# Patient Record
Sex: Female | Born: 1965 | Race: White | Hispanic: No | Marital: Married | Smoking: Current every day smoker
Health system: Southern US, Community
[De-identification: ages and names within clinical notes are randomized; demographics above are authoritative.]

## PROBLEM LIST (undated history)

## (undated) DIAGNOSIS — J45909 Unspecified asthma, uncomplicated: Secondary | ICD-10-CM

## (undated) DIAGNOSIS — IMO0002 Reserved for concepts with insufficient information to code with codable children: Secondary | ICD-10-CM

## (undated) DIAGNOSIS — Q211 Atrial septal defect, unspecified: Secondary | ICD-10-CM

## (undated) DIAGNOSIS — F419 Anxiety disorder, unspecified: Secondary | ICD-10-CM

## (undated) DIAGNOSIS — K219 Gastro-esophageal reflux disease without esophagitis: Secondary | ICD-10-CM

## (undated) DIAGNOSIS — E7800 Pure hypercholesterolemia, unspecified: Secondary | ICD-10-CM

## (undated) DIAGNOSIS — J449 Chronic obstructive pulmonary disease, unspecified: Secondary | ICD-10-CM

## (undated) DIAGNOSIS — G43909 Migraine, unspecified, not intractable, without status migrainosus: Secondary | ICD-10-CM

## (undated) DIAGNOSIS — G894 Chronic pain syndrome: Secondary | ICD-10-CM

## (undated) DIAGNOSIS — M549 Dorsalgia, unspecified: Secondary | ICD-10-CM

## (undated) DIAGNOSIS — I639 Cerebral infarction, unspecified: Secondary | ICD-10-CM

## (undated) DIAGNOSIS — G4734 Idiopathic sleep related nonobstructive alveolar hypoventilation: Secondary | ICD-10-CM

## (undated) DIAGNOSIS — E785 Hyperlipidemia, unspecified: Secondary | ICD-10-CM

## (undated) DIAGNOSIS — I679 Cerebrovascular disease, unspecified: Secondary | ICD-10-CM

## (undated) DIAGNOSIS — I219 Acute myocardial infarction, unspecified: Secondary | ICD-10-CM

## (undated) DIAGNOSIS — F329 Major depressive disorder, single episode, unspecified: Secondary | ICD-10-CM

## (undated) DIAGNOSIS — F32A Depression, unspecified: Secondary | ICD-10-CM

## (undated) HISTORY — PX: APPENDECTOMY: SHX54

## (undated) HISTORY — PX: ABDOMINAL HYSTERECTOMY: SHX81

## (undated) HISTORY — PX: CARDIAC CATHETERIZATION: SHX172

## (undated) HISTORY — PX: CHOLECYSTECTOMY: SHX55

## (undated) HISTORY — PX: TONSILLECTOMY: SUR1361

---

## 2010-06-06 ENCOUNTER — Emergency Department (HOSPITAL_BASED_OUTPATIENT_CLINIC_OR_DEPARTMENT_OTHER)
Admission: EM | Admit: 2010-06-06 | Discharge: 2010-06-06 | Payer: Self-pay | Source: Home / Self Care | Admitting: Emergency Medicine

## 2010-07-04 ENCOUNTER — Emergency Department (HOSPITAL_BASED_OUTPATIENT_CLINIC_OR_DEPARTMENT_OTHER)
Admission: EM | Admit: 2010-07-04 | Discharge: 2010-07-04 | Payer: Self-pay | Source: Home / Self Care | Admitting: Emergency Medicine

## 2010-07-29 ENCOUNTER — Emergency Department (HOSPITAL_BASED_OUTPATIENT_CLINIC_OR_DEPARTMENT_OTHER)
Admission: EM | Admit: 2010-07-29 | Discharge: 2010-07-29 | Payer: Self-pay | Source: Home / Self Care | Admitting: Emergency Medicine

## 2011-02-05 ENCOUNTER — Emergency Department (HOSPITAL_BASED_OUTPATIENT_CLINIC_OR_DEPARTMENT_OTHER)
Admission: EM | Admit: 2011-02-05 | Discharge: 2011-02-05 | Disposition: A | Discharge status: Home / Self Care | Payer: BC Managed Care – PPO | Attending: Emergency Medicine | Admitting: Emergency Medicine

## 2011-02-05 ENCOUNTER — Emergency Department (INDEPENDENT_AMBULATORY_CARE_PROVIDER_SITE_OTHER): Payer: BC Managed Care – PPO

## 2011-02-05 DIAGNOSIS — R109 Unspecified abdominal pain: Secondary | ICD-10-CM | POA: Insufficient documentation

## 2011-02-05 DIAGNOSIS — K7689 Other specified diseases of liver: Secondary | ICD-10-CM | POA: Insufficient documentation

## 2011-02-05 DIAGNOSIS — R160 Hepatomegaly, not elsewhere classified: Secondary | ICD-10-CM | POA: Insufficient documentation

## 2011-02-05 DIAGNOSIS — Z79899 Other long term (current) drug therapy: Secondary | ICD-10-CM | POA: Insufficient documentation

## 2011-02-05 DIAGNOSIS — S301XXA Contusion of abdominal wall, initial encounter: Secondary | ICD-10-CM | POA: Insufficient documentation

## 2011-02-05 DIAGNOSIS — X58XXXA Exposure to other specified factors, initial encounter: Secondary | ICD-10-CM | POA: Insufficient documentation

## 2011-02-05 DIAGNOSIS — G8929 Other chronic pain: Secondary | ICD-10-CM | POA: Insufficient documentation

## 2011-02-05 DIAGNOSIS — F17200 Nicotine dependence, unspecified, uncomplicated: Secondary | ICD-10-CM | POA: Insufficient documentation

## 2011-02-05 LAB — DIFFERENTIAL
Basophils Absolute: 0 10*3/uL (ref 0.0–0.1)
Eosinophils Relative: 3 % (ref 0–5)
Lymphocytes Relative: 32 % (ref 12–46)
Lymphs Abs: 3.2 10*3/uL (ref 0.7–4.0)
Neutro Abs: 5.8 10*3/uL (ref 1.7–7.7)

## 2011-02-05 LAB — COMPREHENSIVE METABOLIC PANEL
Alkaline Phosphatase: 108 U/L (ref 39–117)
BUN: 12 mg/dL (ref 6–23)
CO2: 27 mEq/L (ref 19–32)
Chloride: 104 mEq/L (ref 96–112)
Creatinine, Ser: 0.5 mg/dL (ref 0.50–1.10)
GFR calc Af Amer: >60 mL/min (ref >60)
GFR calc non Af Amer: >60 mL/min (ref >60)
Glucose, Bld: 96 mg/dL (ref 70–99)
Potassium: 3.7 mEq/L (ref 3.5–5.1)
Total Bilirubin: 0.1 mg/dL — ABNORMAL LOW (ref 0.3–1.2)

## 2011-02-05 LAB — CBC
HCT: 37.1 % (ref 36.0–46.0)
Hemoglobin: 12.8 g/dL (ref 12.0–15.0)
MCV: 88.5 fL (ref 78.0–100.0)
RDW: 12.3 % (ref 11.5–15.5)
WBC: 10.1 10*3/uL (ref 4.0–10.5)

## 2011-03-02 ENCOUNTER — Emergency Department (INDEPENDENT_AMBULATORY_CARE_PROVIDER_SITE_OTHER): Payer: Medicare Other

## 2011-03-02 ENCOUNTER — Encounter: Payer: Self-pay | Admitting: *Deleted

## 2011-03-02 ENCOUNTER — Emergency Department (HOSPITAL_BASED_OUTPATIENT_CLINIC_OR_DEPARTMENT_OTHER)
Admission: EM | Admit: 2011-03-02 | Discharge: 2011-03-02 | Disposition: A | Discharge status: Home / Self Care | Payer: Medicare Other | Attending: Emergency Medicine | Admitting: Emergency Medicine

## 2011-03-02 DIAGNOSIS — Z79899 Other long term (current) drug therapy: Secondary | ICD-10-CM | POA: Insufficient documentation

## 2011-03-02 DIAGNOSIS — J449 Chronic obstructive pulmonary disease, unspecified: Secondary | ICD-10-CM | POA: Insufficient documentation

## 2011-03-02 DIAGNOSIS — R51 Headache: Secondary | ICD-10-CM

## 2011-03-02 DIAGNOSIS — E7800 Pure hypercholesterolemia, unspecified: Secondary | ICD-10-CM | POA: Insufficient documentation

## 2011-03-02 DIAGNOSIS — IMO0002 Reserved for concepts with insufficient information to code with codable children: Secondary | ICD-10-CM | POA: Insufficient documentation

## 2011-03-02 DIAGNOSIS — G9389 Other specified disorders of brain: Secondary | ICD-10-CM

## 2011-03-02 DIAGNOSIS — Z8679 Personal history of other diseases of the circulatory system: Secondary | ICD-10-CM | POA: Insufficient documentation

## 2011-03-02 DIAGNOSIS — J4489 Other specified chronic obstructive pulmonary disease: Secondary | ICD-10-CM | POA: Insufficient documentation

## 2011-03-02 DIAGNOSIS — I252 Old myocardial infarction: Secondary | ICD-10-CM | POA: Insufficient documentation

## 2011-03-02 DIAGNOSIS — F411 Generalized anxiety disorder: Secondary | ICD-10-CM | POA: Insufficient documentation

## 2011-03-02 HISTORY — DX: Migraine, unspecified, not intractable, without status migrainosus: G43.909

## 2011-03-02 HISTORY — DX: Cerebral infarction, unspecified: I63.9

## 2011-03-02 HISTORY — DX: Pure hypercholesterolemia, unspecified: E78.00

## 2011-03-02 HISTORY — DX: Reserved for concepts with insufficient information to code with codable children: IMO0002

## 2011-03-02 HISTORY — DX: Anxiety disorder, unspecified: F41.9

## 2011-03-02 HISTORY — DX: Chronic obstructive pulmonary disease, unspecified: J44.9

## 2011-03-02 HISTORY — DX: Acute myocardial infarction, unspecified: I21.9

## 2011-03-02 MED ORDER — HYDROMORPHONE HCL 1 MG/ML IJ SOLN
1.0000 mg | Freq: Once | INTRAMUSCULAR | Status: AC
  Administered 2011-03-02: 1 mg via INTRAVENOUS
  Filled 2011-03-02: qty 1

## 2011-03-02 MED ORDER — DEXAMETHASONE SODIUM PHOSPHATE 10 MG/ML IJ SOLN
10.0000 mg | Freq: Once | INTRAMUSCULAR | Status: AC
  Administered 2011-03-02: 10 mg via INTRAVENOUS
  Filled 2011-03-02: qty 1

## 2011-03-02 MED ORDER — DIPHENHYDRAMINE HCL 50 MG/ML IJ SOLN
25.0000 mg | Freq: Once | INTRAMUSCULAR | Status: AC
  Administered 2011-03-02: 25 mg via INTRAVENOUS
  Filled 2011-03-02: qty 1

## 2011-03-02 MED ORDER — ONDANSETRON HCL 4 MG/2ML IJ SOLN
4.0000 mg | Freq: Once | INTRAMUSCULAR | Status: AC
  Administered 2011-03-02: 4 mg via INTRAVENOUS
  Filled 2011-03-02: qty 2

## 2011-03-02 NOTE — ED Notes (Signed)
Migraine intermittently x2 weeks, but worsened this morning. Sensitive to light/sound. Nausea despite taking zofran today.
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
## 2011-03-14 ENCOUNTER — Emergency Department (HOSPITAL_BASED_OUTPATIENT_CLINIC_OR_DEPARTMENT_OTHER)
Admission: EM | Admit: 2011-03-14 | Discharge: 2011-03-15 | Disposition: A | Discharge status: Home / Self Care | Payer: BC Managed Care – PPO | Attending: Emergency Medicine | Admitting: Emergency Medicine

## 2011-03-14 ENCOUNTER — Emergency Department (INDEPENDENT_AMBULATORY_CARE_PROVIDER_SITE_OTHER): Payer: BC Managed Care – PPO

## 2011-03-14 ENCOUNTER — Encounter (HOSPITAL_BASED_OUTPATIENT_CLINIC_OR_DEPARTMENT_OTHER): Payer: Self-pay | Admitting: *Deleted

## 2011-03-14 ENCOUNTER — Other Ambulatory Visit: Payer: Self-pay

## 2011-03-14 DIAGNOSIS — R079 Chest pain, unspecified: Secondary | ICD-10-CM | POA: Insufficient documentation

## 2011-03-14 DIAGNOSIS — J4489 Other specified chronic obstructive pulmonary disease: Secondary | ICD-10-CM | POA: Insufficient documentation

## 2011-03-14 DIAGNOSIS — I517 Cardiomegaly: Secondary | ICD-10-CM

## 2011-03-14 DIAGNOSIS — E7800 Pure hypercholesterolemia, unspecified: Secondary | ICD-10-CM | POA: Insufficient documentation

## 2011-03-14 DIAGNOSIS — J449 Chronic obstructive pulmonary disease, unspecified: Secondary | ICD-10-CM | POA: Insufficient documentation

## 2011-03-14 DIAGNOSIS — Z8679 Personal history of other diseases of the circulatory system: Secondary | ICD-10-CM | POA: Insufficient documentation

## 2011-03-14 DIAGNOSIS — I252 Old myocardial infarction: Secondary | ICD-10-CM | POA: Insufficient documentation

## 2011-03-14 DIAGNOSIS — R6884 Jaw pain: Secondary | ICD-10-CM | POA: Insufficient documentation

## 2011-03-14 DIAGNOSIS — M79609 Pain in unspecified limb: Secondary | ICD-10-CM | POA: Insufficient documentation

## 2011-03-14 LAB — CBC
MCV: 90.8 fL (ref 78.0–100.0)
Platelets: 291 10*3/uL (ref 150–400)
RBC: 4.14 MIL/uL (ref 3.87–5.11)
RDW: 12.8 % (ref 11.5–15.5)
WBC: 9.5 10*3/uL (ref 4.0–10.5)

## 2011-03-14 LAB — BASIC METABOLIC PANEL
CO2: 30 mEq/L (ref 19–32)
Chloride: 103 mEq/L (ref 96–112)
Creatinine, Ser: 0.6 mg/dL (ref 0.50–1.10)
GFR calc Af Amer: >60 mL/min (ref >60)
Sodium: 141 mEq/L (ref 135–145)

## 2011-03-14 LAB — TROPONIN I: Troponin I: <0.3 ng/mL (ref <0.30)

## 2011-03-14 LAB — CK TOTAL AND CKMB (NOT AT ARMC): Relative Index: INVALID (ref 0.0–2.5)

## 2011-03-14 MED ORDER — NITROGLYCERIN IN D5W 200-5 MCG/ML-% IV SOLN
5.0000 ug/min | Freq: Once | INTRAVENOUS | Status: AC
  Administered 2011-03-14: 5 ug/min via INTRAVENOUS
  Filled 2011-03-14: qty 250

## 2011-03-14 NOTE — ED Provider Notes (Signed)
History     CSN: [id number] Arrival date & time: 03/14/2011  9:24 PM  Chief Complaint  Patient presents with  . Chest Pain   Patient is a 45 y.o. female presenting with chest pain. The history is provided by the patient.  Chest Pain The chest pain began 1 - 2 hours ago. Chest pain occurs constantly. The chest pain is unchanged. The severity of the pain is moderate. The quality of the pain is described as aching. The pain radiates to the left jaw and left shoulder. Pertinent negatives for primary symptoms include no fever, no fatigue, no cough, no abdominal pain, no nausea and no vomiting.  Pertinent negatives for associated symptoms include no diaphoresis.   patient states that she has had chest pain radiating to her left arm and jaw for the last 2 hours. She states that she does not know if this feels like her previous heart problems. She states that she took a zofran and nitro with some relief of the pain. She states that she now has a headahce. No dyspnea. No fevers. She states that her cardiologist put mesh in the back of her heart. She states that she needs something for the chest pain.   Past Medical History  Diagnosis Date  . Stroke   . MI (myocardial infarction)   . High cholesterol   . Degenerative disc disease   . COPD (chronic obstructive pulmonary disease)   . Migraines   . Anxiety     Past Surgical History  Procedure Date  . Abdominal hysterectomy   . Cardiac catheterization   . Cesarean section   . Cholecystectomy   . Tonsillectomy   . Appendectomy     No family history on file.  History  Substance Use Topics  . Smoking status: Current Everyday Smoker  . Smokeless tobacco: Not on file  . Alcohol Use: No    OB History    Grav Para Term Preterm Abortions TAB SAB Ect Mult Living                  Review of Systems  Constitutional: Negative for fever, diaphoresis, activity change, appetite change and fatigue.  HENT: Negative for sneezing and sinus  pressure.   Respiratory: Negative for cough and stridor.   Cardiovascular: Positive for chest pain.  Gastrointestinal: Negative for nausea, vomiting, abdominal pain and constipation.  Genitourinary: Negative for flank pain.  Musculoskeletal: Negative for back pain.  Neurological: Positive for headaches. Negative for light-headedness.  Psychiatric/Behavioral: Negative for confusion.    Physical Exam  BP 105/69  Pulse 79  SpO2 97%  Physical Exam  Nursing note and vitals reviewed. Constitutional: She is oriented to person, place, and time. She appears well-developed and well-nourished.  HENT:  Head: Normocephalic and atraumatic.  Eyes: EOM are normal. Pupils are equal, round, and reactive to light.  Neck: Normal range of motion. Neck supple.  Cardiovascular: Normal rate, regular rhythm and normal heart sounds.   No murmur heard. Pulmonary/Chest: Effort normal and breath sounds normal. No respiratory distress. She has no wheezes. She has no rales. She exhibits tenderness.  Abdominal: Soft. Bowel sounds are normal. She exhibits no distension. There is no tenderness. There is no rebound and no guarding.  Musculoskeletal: Normal range of motion.  Neurological: She is alert and oriented to person, place, and time. No cranial nerve deficit.  Skin: Skin is warm and dry.  Psychiatric: She has a normal mood and affect. Her speech is normal.    ED  Course  Procedures  Date: 03/14/2011  Rate: 2128  Rhythm: normal sinus rhythm  QRS Axis: normal  Intervals: normal  ST/T Wave abnormalities: normal  Conduction Disutrbances:incomplete RBBB  Narrative Interpretation:   Old EKG Reviewed: none available  Results for orders placed during the hospital encounter of 03/14/11  CBC      Component Value Range   WBC 9.5  4.0 - 10.5 (K/uL)   RBC 4.14  3.87 - 5.11 (MIL/uL)   Hemoglobin 12.6  12.0 - 15.0 (g/dL)   HCT 91.4  78.2 - 95.6 (%)   MCV 90.8  78.0 - 100.0 (fL)   MCH 30.4  26.0 - 34.0 (pg)     MCHC 33.5  30.0 - 36.0 (g/dL)   RDW 21.3  08.6 - 57.8 (%)   Platelets 291  150 - 400 (K/uL)  BASIC METABOLIC PANEL      Component Value Range   Sodium 141  135 - 145 (mEq/L)   Potassium 3.7  3.5 - 5.1 (mEq/L)   Chloride 103  96 - 112 (mEq/L)   CO2 30  19 - 32 (mEq/L)   Glucose, Bld 113 (*) 70 - 99 (mg/dL)   BUN 15  6 - 23 (mg/dL)   Creatinine, Ser 4.69  0.50 - 1.10 (mg/dL)   Calcium 9.3  8.4 - 62.9 (mg/dL)   GFR calc non Af Amer >60  >60 (mL/min)   GFR calc Af Amer >60  >60 (mL/min)  TROPONIN I      Component Value Range   Troponin I <0.30  <0.30 (ng/mL)  CK TOTAL AND CKMB      Component Value Range   Total CK 70  7 - 177 (U/L)   CK, MB 1.3  0.3 - 4.0 (ng/mL)   Relative Index RELATIVE INDEX IS INVALID  0.0 - 2.5     Dg Chest Portable 1 View  03/14/2011  *RADIOLOGY REPORT*  Clinical Data: Chest pain  PORTABLE CHEST - 1 VIEW  Comparison: None.  Findings: Interstitial prominence, possibly reflecting mild interstitial edema. No pleural effusion or pneumothorax.  Cardiomegaly.  IMPRESSION: Cardiomegaly with interstitial prominence, possibly reflecting mild interstitial edema.  Original Report Authenticated By: Charline Bills, M.D.    MDM Chest pain. Reported cardiac history. EKG and labs reassuring. Patient does not want to stay for further evaluation of her chest pain. She states that she will follow with her cardiologist. She asks if she can just get some Dilaudid for her headache. She was told she would not.       Juliet Rude. Rubin Payor, MD 03/14/11 [phone]

## 2011-03-14 NOTE — ED Notes (Signed)
Pt reports NTG from home expired in 2010.

## 2011-03-14 NOTE — ED Notes (Signed)
Pt began having chest pain at 8pm,left chest radiating to left arm and into left jaw. Was nauseated and took some zofran, nitro, and aspirin. Pt has a hx of MI, CABG, and stroke, current smoker

## 2011-07-17 ENCOUNTER — Emergency Department (INDEPENDENT_AMBULATORY_CARE_PROVIDER_SITE_OTHER): Payer: BC Managed Care – PPO

## 2011-07-17 ENCOUNTER — Emergency Department (HOSPITAL_BASED_OUTPATIENT_CLINIC_OR_DEPARTMENT_OTHER)
Admission: EM | Admit: 2011-07-17 | Discharge: 2011-07-17 | Disposition: A | Discharge status: Home / Self Care | Payer: BC Managed Care – PPO | Attending: Emergency Medicine | Admitting: Emergency Medicine

## 2011-07-17 ENCOUNTER — Encounter (HOSPITAL_BASED_OUTPATIENT_CLINIC_OR_DEPARTMENT_OTHER): Payer: Self-pay | Admitting: *Deleted

## 2011-07-17 DIAGNOSIS — M25559 Pain in unspecified hip: Secondary | ICD-10-CM | POA: Insufficient documentation

## 2011-07-17 DIAGNOSIS — M25551 Pain in right hip: Secondary | ICD-10-CM

## 2011-07-17 DIAGNOSIS — I252 Old myocardial infarction: Secondary | ICD-10-CM | POA: Insufficient documentation

## 2011-07-17 DIAGNOSIS — F17200 Nicotine dependence, unspecified, uncomplicated: Secondary | ICD-10-CM | POA: Insufficient documentation

## 2011-07-17 DIAGNOSIS — J449 Chronic obstructive pulmonary disease, unspecified: Secondary | ICD-10-CM | POA: Insufficient documentation

## 2011-07-17 DIAGNOSIS — J4489 Other specified chronic obstructive pulmonary disease: Secondary | ICD-10-CM | POA: Insufficient documentation

## 2011-07-17 DIAGNOSIS — W19XXXA Unspecified fall, initial encounter: Secondary | ICD-10-CM

## 2011-07-17 DIAGNOSIS — M79609 Pain in unspecified limb: Secondary | ICD-10-CM

## 2011-07-17 DIAGNOSIS — M25561 Pain in right knee: Secondary | ICD-10-CM

## 2011-07-17 DIAGNOSIS — Z79899 Other long term (current) drug therapy: Secondary | ICD-10-CM | POA: Insufficient documentation

## 2011-07-17 DIAGNOSIS — M25569 Pain in unspecified knee: Secondary | ICD-10-CM

## 2011-07-17 DIAGNOSIS — Z8679 Personal history of other diseases of the circulatory system: Secondary | ICD-10-CM | POA: Insufficient documentation

## 2011-07-17 DIAGNOSIS — M79639 Pain in unspecified forearm: Secondary | ICD-10-CM

## 2011-07-17 DIAGNOSIS — M25539 Pain in unspecified wrist: Secondary | ICD-10-CM | POA: Insufficient documentation

## 2011-07-17 DIAGNOSIS — E7800 Pure hypercholesterolemia, unspecified: Secondary | ICD-10-CM | POA: Insufficient documentation

## 2011-07-17 NOTE — ED Notes (Signed)
D/c home with family- no rx given 

## 2011-07-17 NOTE — ED Notes (Signed)
States she fell in parking lot on painted surface that was wet- fall occurred last night- c/o right knee and hip pain; c/o left arm pain- denies LOC

## 2011-07-17 NOTE — ED Provider Notes (Signed)
History     CSN: [id number] Arrival date & time: 07/17/2011  4:33 PM   First MD Initiated Contact with Patient 07/17/11 1715      Chief Complaint  Patient presents with  . Fall  . Knee Pain  . Hip Pain  . Arm Pain    (Consider location/radiation/quality/duration/timing/severity/associated sxs/prior treatment) Patient is a 45 y.o. female presenting with fall. The history is provided by the patient. No language interpreter was used.  Fall The accident occurred yesterday. The fall occurred while walking. She fell from a height of 3 to 5 ft. She landed on concrete. There was no blood loss. The point of impact was the left elbow. The pain is present in the right hip. The pain is at a severity of 5/10. The pain is moderate. She was ambulatory at the scene. There was no entrapment after the fall. There was no drug use involved in the accident. There was no alcohol use involved in the accident. Pertinent negatives include no visual change, no numbness and no vomiting. The symptoms are aggravated by standing. She has tried ice for the symptoms. The treatment provided moderate relief.  Pt reports she slipped and fell on wet ground at gas station yesterday.  Pt complains of pain in herright knee, right hip and left forearm.  No impact of head.  No neck or back pain.  Pt reports she has is on morphine for chronic problems. Pt reports she does not need anything for pain.  Past Medical History  Diagnosis Date  . Stroke   . MI (myocardial infarction)   . High cholesterol   . Degenerative disc disease   . COPD (chronic obstructive pulmonary disease)   . Migraines   . Anxiety     Past Surgical History  Procedure Date  . Abdominal hysterectomy   . Cardiac catheterization   . Cesarean section   . Cholecystectomy   . Tonsillectomy   . Appendectomy     No family history on file.  History  Substance Use Topics  . Smoking status: Current Everyday Smoker -- 1.0 packs/day    Types: Cigarettes    . Smokeless tobacco: Never Used  . Alcohol Use: No    OB History    Grav Para Term Preterm Abortions TAB SAB Ect Mult Living                  Review of Systems  Gastrointestinal: Negative for vomiting.  Musculoskeletal: Positive for myalgias. Negative for joint swelling and gait problem.  Neurological: Negative for numbness.  All other systems reviewed and are negative.    Allergies  Shellfish allergy; Fioricet; Imitrex; Lodine; Phenergan; Prednisone; Reglan; Relpax; Stadol; Topamax; Toradol; Tramadol; and Vistaril  Home Medications   Current Outpatient Rx  Name Route Sig Dispense Refill  . ASPIRIN 81 MG PO TABS Oral Take 81 mg by mouth daily.      . ATORVASTATIN CALCIUM 80 MG PO TABS Oral Take 80 mg by mouth daily.      [employer] IN Inhalation Inhale 2 puffs into the lungs at bedtime.      Marland Kitchen CLONAZEPAM 1 MG PO TABS Oral Take 1 mg by mouth 3 (three) times daily.      Marland Kitchen CLOPIDOGREL BISULFATE 75 MG PO TABS Oral Take 75 mg by mouth daily.      . DESVENLAFAXINE SUCCINATE ER 50 MG PO TB24 Oral Take 50 mg by mouth 2 (two) times daily.      Marland Kitchen DIVALPROEX SODIUM  ER 500 MG PO TB24 Oral Take 500 mg by mouth 2 (two) times daily.      Marland Kitchen FLUTICASONE-SALMETEROL 500-50 MCG/DOSE IN AEPB Inhalation Inhale 1 puff into the lungs every 12 (twelve) hours.      . FUROSEMIDE 20 MG PO TABS Oral Take 20 mg by mouth daily.     Marland Kitchen GABAPENTIN 300 MG PO CAPS Oral Take 600 mg by mouth 3 (three) times daily.      Marland Kitchen LISINOPRIL 40 MG PO TABS Oral Take 40 mg by mouth daily.      . MORPHINE SULFATE 15 MG PO TABS Oral Take 15 mg by mouth 3 (three) times daily.      Marland Kitchen ONDANSETRON HCL 8 MG PO TABS Oral Take 8 mg by mouth 2 (two) times daily as needed. Nausea and vomiting     . PANTOPRAZOLE SODIUM 40 MG PO TBEC Oral Take 40 mg by mouth daily.      Marland Kitchen ZOLPIDEM TARTRATE 10 MG PO TABS Oral Take 10 mg by mouth at bedtime.      Marland Kitchen NITROGLYCERIN 0.4 MG SL SUBL Sublingual Place 0.4 mg under the tongue every 5 (five)  minutes as needed.        BP 114/74  Pulse 99  Temp(Src) 98.8 F (37.1 C) (Oral)  Resp 18  Ht 5\' 6"  (1.676 m)  Wt 204 lb (92.534 kg)  BMI 32.93 kg/m2  SpO2 94%  Physical Exam  Nursing note and vitals reviewed. Constitutional: She appears well-developed and well-nourished.  HENT:  Head: Normocephalic and atraumatic.  Right Ear: External ear normal.  Left Ear: External ear normal.  Mouth/Throat: Oropharynx is clear and moist.  Eyes: Conjunctivae and EOM are normal. Pupils are equal, round, and reactive to light.  Neck: Normal range of motion. Neck supple.  Cardiovascular: Normal rate, regular rhythm and normal heart sounds.   Pulmonary/Chest: Effort normal.  Abdominal: Soft.  Musculoskeletal: Normal range of motion.       Tender right hip, no deformity,  Right knee pain with movement.  Left forearm,  No bruising,  From wrist and elbow  Neurological: She is alert.  Skin: Skin is warm.  Psychiatric: She has a normal mood and affect.    ED Course  Procedures (including critical care time)  Labs Reviewed - No data to display No results found.   No diagnosis found.    MDM  No fx,  Pt advised to continue her pain medications.    Results for orders placed during the hospital encounter of 03/14/11  CBC      Component Value Range   WBC 9.5  4.0 - 10.5 (K/uL)   RBC 4.14  3.87 - 5.11 (MIL/uL)   Hemoglobin 12.6  12.0 - 15.0 (g/dL)   HCT 65.7  84.6 - 96.2 (%)   MCV 90.8  78.0 - 100.0 (fL)   MCH 30.4  26.0 - 34.0 (pg)   MCHC 33.5  30.0 - 36.0 (g/dL)   RDW 95.2  84.1 - 32.4 (%)   Platelets 291  150 - 400 (K/uL)  BASIC METABOLIC PANEL      Component Value Range   Sodium 141  135 - 145 (mEq/L)   Potassium 3.7  3.5 - 5.1 (mEq/L)   Chloride 103  96 - 112 (mEq/L)   CO2 30  19 - 32 (mEq/L)   Glucose, Bld 113 (*) 70 - 99 (mg/dL)   BUN 15  6 - 23 (mg/dL)   Creatinine, Ser 4.01  0.50 - 1.10 (mg/dL)   Calcium 9.3  8.4 - 54.0 (mg/dL)   GFR calc non Af Amer >60  >60 (mL/min)     GFR calc Af Amer >60  >60 (mL/min)  TROPONIN I      Component Value Range   Troponin I <0.30  <0.30 (ng/mL)  CK TOTAL AND CKMB      Component Value Range   Total CK 70  7 - 177 (U/L)   CK, MB 1.3  0.3 - 4.0 (ng/mL)   Relative Index RELATIVE INDEX IS INVALID  0.0 - 2.5    Dg Forearm Left  07/17/2011  *RADIOLOGY REPORT*  Clinical Data: Fall.  Pain along the ulnar side of the forearm.  LEFT FOREARM - 2 VIEW  Comparison: None.  Findings: No acute bone or soft tissue abnormality is present.  The wrist and elbow joints are intact.  IMPRESSION: Negative left forearm.  Original Report Authenticated By: Jamesetta Orleans. MATTERN, M.D.   Dg Hip Complete Right  07/17/2011  *RADIOLOGY REPORT*  Clinical Data: Fall.  Right hip pain.  RIGHT HIP - COMPLETE 2+ VIEW  Comparison: None.  Findings: Single AP view the pelvis is unremarkable.  The right hip is located.  No acute bone or soft tissue abnormality is present.  IMPRESSION: Negative right hip.  Original Report Authenticated By: Jamesetta Orleans. MATTERN, M.D.   Dg Knee Complete 4 Views Right  07/17/2011  *RADIOLOGY REPORT*  Clinical Data: Fall.  Right knee pain.  RIGHT KNEE - COMPLETE 4+ VIEW  Comparison: None available.  Findings: Right knee is located.  No acute bone or soft tissue abnormality is present.  There is no significant effusion.  IMPRESSION: Negative right knee.  Original Report Authenticated By: Jamesetta Orleans. MATTERN, M.D.           [place], [place] 07/17/11 [phone]

## 2011-07-17 NOTE — ED Provider Notes (Signed)
Medical screening examination/treatment/procedure(s) were performed by non-physician practitioner and as supervising physician I was immediately available for consultation/collaboration.   Forbes Cellar, MD 07/17/11 [phone]

## 2011-10-04 ENCOUNTER — Emergency Department (INDEPENDENT_AMBULATORY_CARE_PROVIDER_SITE_OTHER): Payer: BC Managed Care – PPO

## 2011-10-04 ENCOUNTER — Emergency Department (HOSPITAL_BASED_OUTPATIENT_CLINIC_OR_DEPARTMENT_OTHER)
Admission: EM | Admit: 2011-10-04 | Discharge: 2011-10-04 | Disposition: A | Discharge status: Home / Self Care | Payer: BC Managed Care – PPO | Attending: Emergency Medicine | Admitting: Emergency Medicine

## 2011-10-04 ENCOUNTER — Encounter (HOSPITAL_BASED_OUTPATIENT_CLINIC_OR_DEPARTMENT_OTHER): Payer: Self-pay | Admitting: *Deleted

## 2011-10-04 DIAGNOSIS — J449 Chronic obstructive pulmonary disease, unspecified: Secondary | ICD-10-CM | POA: Insufficient documentation

## 2011-10-04 DIAGNOSIS — S7010XA Contusion of unspecified thigh, initial encounter: Secondary | ICD-10-CM | POA: Insufficient documentation

## 2011-10-04 DIAGNOSIS — L03116 Cellulitis of left lower limb: Secondary | ICD-10-CM

## 2011-10-04 DIAGNOSIS — W1809XA Striking against other object with subsequent fall, initial encounter: Secondary | ICD-10-CM | POA: Insufficient documentation

## 2011-10-04 DIAGNOSIS — E7800 Pure hypercholesterolemia, unspecified: Secondary | ICD-10-CM | POA: Insufficient documentation

## 2011-10-04 DIAGNOSIS — L02419 Cutaneous abscess of limb, unspecified: Secondary | ICD-10-CM | POA: Insufficient documentation

## 2011-10-04 DIAGNOSIS — I252 Old myocardial infarction: Secondary | ICD-10-CM | POA: Insufficient documentation

## 2011-10-04 DIAGNOSIS — Z8673 Personal history of transient ischemic attack (TIA), and cerebral infarction without residual deficits: Secondary | ICD-10-CM | POA: Insufficient documentation

## 2011-10-04 DIAGNOSIS — J4489 Other specified chronic obstructive pulmonary disease: Secondary | ICD-10-CM | POA: Insufficient documentation

## 2011-10-04 DIAGNOSIS — M79609 Pain in unspecified limb: Secondary | ICD-10-CM | POA: Insufficient documentation

## 2011-10-04 DIAGNOSIS — S8012XA Contusion of left lower leg, initial encounter: Secondary | ICD-10-CM

## 2011-10-04 DIAGNOSIS — W19XXXA Unspecified fall, initial encounter: Secondary | ICD-10-CM

## 2011-10-04 DIAGNOSIS — Z79899 Other long term (current) drug therapy: Secondary | ICD-10-CM | POA: Insufficient documentation

## 2011-10-04 DIAGNOSIS — Z7982 Long term (current) use of aspirin: Secondary | ICD-10-CM | POA: Insufficient documentation

## 2011-10-04 LAB — CBC
HCT: 42.1 % (ref 36.0–46.0)
Hemoglobin: 14.4 g/dL (ref 12.0–15.0)
RBC: 4.71 MIL/uL (ref 3.87–5.11)
WBC: 8.9 10*3/uL (ref 4.0–10.5)

## 2011-10-04 LAB — BASIC METABOLIC PANEL
BUN: 10 mg/dL (ref 6–23)
CO2: 30 mEq/L (ref 19–32)
Chloride: 103 mEq/L (ref 96–112)
Creatinine, Ser: 0.6 mg/dL (ref 0.50–1.10)
Potassium: 3.7 mEq/L (ref 3.5–5.1)

## 2011-10-04 LAB — DIFFERENTIAL
Lymphocytes Relative: 47 % — ABNORMAL HIGH (ref 12–46)
Monocytes Absolute: 0.6 10*3/uL (ref 0.1–1.0)
Monocytes Relative: 7 % (ref 3–12)
Neutro Abs: 3.8 10*3/uL (ref 1.7–7.7)
Neutrophils Relative %: 42 % — ABNORMAL LOW (ref 43–77)

## 2011-10-04 MED ORDER — CEPHALEXIN 500 MG PO CAPS: 500.0000 mg | ORAL_CAPSULE | Freq: Four times a day (QID) | ORAL | Status: DC

## 2011-10-04 MED ORDER — DIPHENHYDRAMINE HCL 50 MG/ML IJ SOLN
12.5000 mg | Freq: Once | INTRAMUSCULAR | Status: AC
  Administered 2011-10-04: 12.5 mg via INTRAVENOUS

## 2011-10-04 MED ORDER — DOXYCYCLINE HYCLATE 100 MG PO CAPS: 100.0000 mg | ORAL_CAPSULE | Freq: Two times a day (BID) | ORAL | Status: AC

## 2011-10-04 MED ORDER — CEPHALEXIN 500 MG PO CAPS: 500.0000 mg | ORAL_CAPSULE | Freq: Four times a day (QID) | ORAL | Status: AC

## 2011-10-04 MED ORDER — SODIUM CHLORIDE 0.9 % IV SOLN
Freq: Once | INTRAVENOUS | Status: AC
  Administered 2011-10-04: 22:00:00 via INTRAVENOUS

## 2011-10-04 MED ORDER — DIPHENHYDRAMINE HCL 50 MG/ML IJ SOLN
INTRAMUSCULAR | Status: AC
  Filled 2011-10-04: qty 1

## 2011-10-04 MED ORDER — VANCOMYCIN HCL IN DEXTROSE 1-5 GM/200ML-% IV SOLN
1000.0000 mg | Freq: Once | INTRAVENOUS | Status: AC
  Administered 2011-10-04: 1000 mg via INTRAVENOUS
  Filled 2011-10-04: qty 200

## 2011-10-04 MED ORDER — DEXTROSE 5 % IV SOLN
1.0000 g | INTRAVENOUS | Status: DC
  Administered 2011-10-04: 1 g via INTRAVENOUS
  Filled 2011-10-04: qty 10

## 2011-10-04 NOTE — ED Notes (Signed)
Area of swelling and redness to left leg and pain down left leg that started around noon today

## 2011-10-04 NOTE — ED Notes (Signed)
Pt alert oriented x 3 amb, redness has cleared, pt states no resp difficulty

## 2011-10-04 NOTE — ED Notes (Signed)
Pt c/o itching and redness to face, neck and back, vanc stopped and PA notified. Benadryl given

## 2011-10-04 NOTE — Discharge Instructions (Signed)

## 2011-10-04 NOTE — ED Provider Notes (Signed)
History     CSN: [id number]  Arrival date & time 10/04/11  2010   First MD Initiated Contact with Patient 10/04/11 2107      Chief Complaint  Patient presents with  . Leg Pain    (Consider location/radiation/quality/duration/timing/severity/associated sxs/prior treatment) Patient is a 46 y.o. female presenting with leg pain. The history is provided by the patient. No language interpreter was used.  Leg Pain  The incident occurred 6 to 12 hours ago. The incident occurred at home. The injury mechanism was a fall. The pain is present in the left leg. The quality of the pain is described as aching. The pain is at a severity of 4/10. The pain is moderate. The pain has been constant since onset. She reports no foreign bodies present. The symptoms are aggravated by nothing. She has tried nothing for the symptoms.  Pt reports she fell 2 days ago and hit leg.  Pt reports today she noticed a red streak going down her leg.  Past Medical History  Diagnosis Date  . Stroke   . MI (myocardial infarction)   . High cholesterol   . Degenerative disc disease   . COPD (chronic obstructive pulmonary disease)   . Migraines   . Anxiety     Past Surgical History  Procedure Date  . Abdominal hysterectomy   . Cardiac catheterization   . Cesarean section   . Cholecystectomy   . Tonsillectomy   . Appendectomy     No family history on file.  History  Substance Use Topics  . Smoking status: Current Everyday Smoker -- 1.0 packs/day    Types: Cigarettes  . Smokeless tobacco: Never Used  . Alcohol Use: No    OB History    Grav Para Term Preterm Abortions TAB SAB Ect Mult Living                  Review of Systems  Skin: Positive for color change.  All other systems reviewed and are negative.    Allergies  Shellfish allergy; Fioricet; Imitrex; Lodine; Phenergan; Prednisone; Reglan; Relpax; Stadol; Topamax; Toradol; Tramadol; and Vistaril  Home Medications   Current Outpatient Rx    Name Route Sig Dispense Refill  . ASPIRIN 81 MG PO TABS Oral Take 81 mg by mouth daily.      . ATORVASTATIN CALCIUM 80 MG PO TABS Oral Take 80 mg by mouth daily.      [employer] IN Inhalation Inhale 2 puffs into the lungs at bedtime.      Marland Kitchen CLONAZEPAM 1 MG PO TABS Oral Take 1 mg by mouth 4 (four) times daily.     . DESVENLAFAXINE SUCCINATE ER 50 MG PO TB24 Oral Take 50 mg by mouth 2 (two) times daily.      Marland Kitchen DIVALPROEX SODIUM ER 500 MG PO TB24 Oral Take 500 mg by mouth 2 (two) times daily.     Marland Kitchen FLUTICASONE-SALMETEROL 500-50 MCG/DOSE IN AEPB Inhalation Inhale 1 puff into the lungs every 12 (twelve) hours.      . FUROSEMIDE 20 MG PO TABS Oral Take 20 mg by mouth daily.     Marland Kitchen GABAPENTIN 400 MG PO CAPS Oral Take 400 mg by mouth 2 (two) times daily.    Marland Kitchen LISINOPRIL 40 MG PO TABS Oral Take 40 mg by mouth daily.      . MORPHINE SULFATE 15 MG PO TABS Oral Take 15 mg by mouth 3 (three) times daily.      Marland Kitchen NITROGLYCERIN 0.4  MG SL SUBL Sublingual Place 0.4 mg under the tongue every 5 (five) minutes as needed. For chest pain    . PANTOPRAZOLE SODIUM 40 MG PO TBEC Oral Take 40 mg by mouth daily.      Marland Kitchen ZOLPIDEM TARTRATE 10 MG PO TABS Oral Take 10 mg by mouth at bedtime.        BP 135/76  Pulse 85  Temp(Src) 98.1 F (36.7 C) (Oral)  Resp 16  SpO2 98%  Physical Exam  Vitals reviewed. Constitutional: She appears well-developed and well-nourished.  HENT:  Head: Normocephalic and atraumatic.  Right Ear: External ear normal.  Left Ear: External ear normal.  Nose: Nose normal.  Mouth/Throat: Oropharynx is clear and moist.  Eyes: Conjunctivae and EOM are normal. Pupils are equal, round, and reactive to light.  Neck: Normal range of motion. Neck supple.  Musculoskeletal: She exhibits tenderness.       Red streak down the front of leg,  Bruised area upper leg, negative homan's  Neurological: She is alert.  Skin: There is erythema.  Psychiatric: She has a normal mood and affect.    ED Course   Procedures (including critical care time)  Labs Reviewed - No data to display No results found.   No diagnosis found.    MDM   Results for orders placed during the hospital encounter of 10/04/11  CBC      Component Value Range   WBC 8.9  4.0 - 10.5 (K/uL)   RBC 4.71  3.87 - 5.11 (MIL/uL)   Hemoglobin 14.4  12.0 - 15.0 (g/dL)   HCT 16.1  09.6 - 04.5 (%)   MCV 89.4  78.0 - 100.0 (fL)   MCH 30.6  26.0 - 34.0 (pg)   MCHC 34.2  30.0 - 36.0 (g/dL)   RDW 40.9  81.1 - 91.4 (%)   Platelets 318  150 - 400 (K/uL)  DIFFERENTIAL      Component Value Range   Neutrophils Relative 42 (*) 43 - 77 (%)   Neutro Abs 3.8  1.7 - 7.7 (K/uL)   Lymphocytes Relative 47 (*) 12 - 46 (%)   Lymphs Abs 4.2 (*) 0.7 - 4.0 (K/uL)   Monocytes Relative 7  3 - 12 (%)   Monocytes Absolute 0.6  0.1 - 1.0 (K/uL)   Eosinophils Relative 4  0 - 5 (%)   Eosinophils Absolute 0.3  0.0 - 0.7 (K/uL)   Basophils Relative 0  0 - 1 (%)   Basophils Absolute 0.0  0.0 - 0.1 (K/uL)  BASIC METABOLIC PANEL      Component Value Range   Sodium 143  135 - 145 (mEq/L)   Potassium 3.7  3.5 - 5.1 (mEq/L)   Chloride 103  96 - 112 (mEq/L)   CO2 30  19 - 32 (mEq/L)   Glucose, Bld 90  70 - 99 (mg/dL)   BUN 10  6 - 23 (mg/dL)   Creatinine, Ser 7.82  0.50 - 1.10 (mg/dL)   Calcium 95.6  8.4 - 10.5 (mg/dL)   GFR calc non Af Amer >90  >90 (mL/min)   GFR calc Af Amer >90  >90 (mL/min)   Dg Tibia/fibula Left  10/04/2011  *RADIOLOGY REPORT*  Clinical Data: Pain post fall.  LEFT TIBIA AND FIBULA - 2 VIEW  Comparison: None.  Findings: No knee effusion.  Small calcaneal spurs. Negative for fracture, dislocation, or other acute abnormality.  Normal alignment and mineralization. No significant degenerative change. Regional soft tissues unremarkable.  IMPRESSION:  Negative  Original Report Authenticated By: Osa Craver, M.D.   Pt given IV Rocephin and voncomycin.  I will treat with keflex and doxycycline.   Pt advised to see her Md  or return here tomorrow for recheck.       Langston Masker, [place] 10/04/11 2255

## 2011-10-04 NOTE — ED Notes (Signed)
Redness to left lower extr marked with marker

## 2011-10-06 NOTE — ED Provider Notes (Signed)
History/physical exam/procedure(s) were performed by non-physician practitioner and as supervising physician I was immediately available for consultation/collaboration. I have reviewed all notes and am in agreement with care and plan.   Hilario Quarry, MD 10/06/11 [phone]

## 2012-03-28 ENCOUNTER — Emergency Department (HOSPITAL_BASED_OUTPATIENT_CLINIC_OR_DEPARTMENT_OTHER)
Admission: EM | Admit: 2012-03-28 | Discharge: 2012-03-28 | Disposition: A | Discharge status: Home / Self Care | Payer: BC Managed Care – PPO | Attending: Emergency Medicine | Admitting: Emergency Medicine

## 2012-03-28 ENCOUNTER — Emergency Department (HOSPITAL_BASED_OUTPATIENT_CLINIC_OR_DEPARTMENT_OTHER): Payer: BC Managed Care – PPO

## 2012-03-28 ENCOUNTER — Encounter (HOSPITAL_BASED_OUTPATIENT_CLINIC_OR_DEPARTMENT_OTHER): Payer: Self-pay | Admitting: *Deleted

## 2012-03-28 DIAGNOSIS — M25462 Effusion, left knee: Secondary | ICD-10-CM

## 2012-03-28 DIAGNOSIS — X58XXXA Exposure to other specified factors, initial encounter: Secondary | ICD-10-CM | POA: Insufficient documentation

## 2012-03-28 DIAGNOSIS — M25469 Effusion, unspecified knee: Secondary | ICD-10-CM | POA: Insufficient documentation

## 2012-03-28 MED ORDER — HYDROCODONE-ACETAMINOPHEN 5-325 MG PO TABS
2.0000 | ORAL_TABLET | Freq: Once | ORAL | Status: AC
  Administered 2012-03-28: 2 via ORAL
  Filled 2012-03-28: qty 2

## 2012-03-28 MED ORDER — HYDROCODONE-ACETAMINOPHEN 5-325 MG PO TABS: 2.0000 | ORAL_TABLET | ORAL | Status: AC | PRN

## 2012-03-28 NOTE — ED Provider Notes (Signed)
History     CSN: [id number]  Arrival date & time 03/28/12  1810   First MD Initiated Contact with Patient 03/28/12 1838      Chief Complaint  Patient presents with  . Knee Injury    (Consider location/radiation/quality/duration/timing/severity/associated sxs/prior treatment) Patient is a 46 y.o. female presenting with knee pain. The history is provided by the patient. No language interpreter was used.  Knee Pain This is a new problem. The current episode started 1 to 4 weeks ago. The problem occurs constantly. The problem has been gradually worsening. Associated symptoms include joint swelling. Nothing aggravates the symptoms. She has tried nothing for the symptoms. The treatment provided no relief.   Pt complains of pain to left knee after twisting knee while in the ocean a [place] ago.   Pt reports she reinjured yesterday on steps.  Past Medical History  Diagnosis Date  . Stroke   . MI (myocardial infarction)   . High cholesterol   . Degenerative disc disease   . COPD (chronic obstructive pulmonary disease)   . Migraines   . Anxiety     Past Surgical History  Procedure Date  . Abdominal hysterectomy   . Cardiac catheterization   . Cesarean section   . Cholecystectomy   . Tonsillectomy   . Appendectomy     History reviewed. No pertinent family history.  History  Substance Use Topics  . Smoking status: Current Everyday Smoker -- 1.0 packs/day    Types: Cigarettes  . Smokeless tobacco: Never Used  . Alcohol Use: No    OB History    Grav Para Term Preterm Abortions TAB SAB Ect Mult Living                  Review of Systems  Musculoskeletal: Positive for joint swelling.  All other systems reviewed and are negative.    Allergies  Shellfish allergy; Fioricet; Imitrex; Ketorolac tromethamine; Lodine; Prednisone; Promethazine hcl; Reglan; Relpax; Stadol; Topamax; Tramadol; Vistaril; and Diflucan  Home Medications   Current Outpatient Rx  Name Route Sig  Dispense Refill  . ASPIRIN 81 MG PO TABS Oral Take 81 mg by mouth daily.     . ATORVASTATIN CALCIUM 80 MG PO TABS Oral Take 80 mg by mouth daily.     [place] IN Inhalation Inhale 2 puffs into the lungs at bedtime.     Marland Kitchen CLONAZEPAM 1 MG PO TABS Oral Take 1 mg by mouth 4 (four) times daily.     . FUROSEMIDE 20 MG PO TABS Oral Take 20 mg by mouth daily.     Marland Kitchen GABAPENTIN 400 MG PO CAPS Oral Take 400 mg by mouth 2 (two) times daily.    Marland Kitchen LISINOPRIL 40 MG PO TABS Oral Take 40 mg by mouth daily.      . MORPHINE SULFATE 15 MG PO TABS Oral Take 15 mg by mouth 3 (three) times daily.     Marland Kitchen NITROGLYCERIN 0.4 MG SL SUBL Sublingual Place 0.4 mg under the tongue every 5 (five) minutes as needed. For chest pain    . PANTOPRAZOLE SODIUM 40 MG PO TBEC Oral Take 40 mg by mouth daily.       BP 138/88  Pulse 88  Temp 98.4 F (36.9 C) (Oral)  Resp 18  Ht 5\' 7"  (1.702 m)  Wt 198 lb (89.812 kg)  BMI 31.01 kg/m2  SpO2 99%  Physical Exam  Nursing note and vitals reviewed. Constitutional: She is oriented to person, place, and time.  She appears well-developed and well-nourished.  HENT:  Head: Normocephalic.  Musculoskeletal: She exhibits tenderness.       Tender left knee, effusion,  Decreased range of motion,  nv and ns intact,  Neg drawer, no medial or lateral instability  Neurological: She is alert and oriented to person, place, and time.  Skin: Skin is warm.  Psychiatric: She has a normal mood and affect.    ED Course  Procedures (including critical care time)  Labs Reviewed - No data to display Dg Knee Complete 4 Views Left  03/28/2012  *RADIOLOGY REPORT*  Clinical Data: 46 year old female with twisting injury and pain.  LEFT KNEE - COMPLETE 4+ VIEW  Comparison: 10/04/2011.  Findings: Small suprapatellar joint effusion.  Patella intact. Joint spaces appear preserved.  No acute fracture or dislocation.  IMPRESSION: Small joint effusion with no acute fracture or dislocation about the left knee.    Original Report Authenticated By: Harley Hallmark, M.D.      No diagnosis found.    MDM  Knee immbolizer, hydrocodone,   Follow up with Dr. Ave Filter [facility]opaedist for evaluation        Elson Areas, [place] 03/28/12 [phone]

## 2012-03-28 NOTE — ED Notes (Signed)
Pt c/o left knee pain after fall 8/12

## 2012-03-28 NOTE — ED Notes (Signed)
She injured her [place] ocean [place]. Was walking up stairs last night and re-injured her knee. States she has had 2 surgeries on the same knee years ago in [place]. Ambulatory to treatment room from triage.

## 2012-03-29 NOTE — ED Provider Notes (Signed)
Medical screening examination/treatment/procedure(s) were performed by non-physician practitioner and as supervising physician I was immediately available for consultation/collaboration.   Gwyneth Sprout, MD 03/29/12 2245

## 2012-05-09 ENCOUNTER — Emergency Department (HOSPITAL_BASED_OUTPATIENT_CLINIC_OR_DEPARTMENT_OTHER): Payer: BC Managed Care – PPO

## 2012-05-09 ENCOUNTER — Encounter (HOSPITAL_BASED_OUTPATIENT_CLINIC_OR_DEPARTMENT_OTHER): Payer: Self-pay | Admitting: *Deleted

## 2012-05-09 ENCOUNTER — Emergency Department (HOSPITAL_BASED_OUTPATIENT_CLINIC_OR_DEPARTMENT_OTHER)
Admission: EM | Admit: 2012-05-09 | Discharge: 2012-05-09 | Disposition: A | Discharge status: Home / Self Care | Payer: BC Managed Care – PPO | Attending: Emergency Medicine | Admitting: Emergency Medicine

## 2012-05-09 DIAGNOSIS — M25551 Pain in right hip: Secondary | ICD-10-CM

## 2012-05-09 DIAGNOSIS — W108XXA Fall (on) (from) other stairs and steps, initial encounter: Secondary | ICD-10-CM | POA: Insufficient documentation

## 2012-05-09 DIAGNOSIS — M25569 Pain in unspecified knee: Secondary | ICD-10-CM | POA: Insufficient documentation

## 2012-05-09 DIAGNOSIS — M25559 Pain in unspecified hip: Secondary | ICD-10-CM | POA: Insufficient documentation

## 2012-05-09 MED ORDER — MORPHINE SULFATE 4 MG/ML IJ SOLN
4.0000 mg | INTRAMUSCULAR | Status: DC | PRN
  Administered 2012-05-09: 4 mg via INTRAMUSCULAR
  Filled 2012-05-09: qty 1

## 2012-05-09 NOTE — ED Notes (Signed)
Fell down 9 steps and landed on her right hip last night.

## 2012-05-09 NOTE — ED Provider Notes (Signed)
History     CSN: [id number]  Arrival date & time 05/09/12  1943   First MD Initiated Contact with Patient 05/09/12 2029      Chief Complaint  Patient presents with  . Fall    (Consider location/radiation/quality/duration/timing/severity/associated sxs/prior treatment) Patient is a 46 y.o. female presenting with fall. The history is provided by the patient. No language interpreter was used.  Fall The accident occurred yesterday. Incident: pt fell down 9 steps. She fell from a height of 6 to 10 ft. She landed on a hard floor. There was no blood loss. The point of impact was the right hip and right knee. The pain is present in the right hip and right knee. The pain is at a severity of 6/10. The pain is moderate. Pertinent negatives include no headaches and no tingling. She has tried nothing for the symptoms.  Pt fell down the   Past Medical History  Diagnosis Date  . Stroke   . MI (myocardial infarction)   . High cholesterol   . Degenerative disc disease   . COPD (chronic obstructive pulmonary disease)   . Migraines   . Anxiety     Past Surgical History  Procedure Date  . Abdominal hysterectomy   . Cardiac catheterization   . Cesarean section   . Cholecystectomy   . Tonsillectomy   . Appendectomy     No family history on file.  History  Substance Use Topics  . Smoking status: Current Every Day Smoker -- 1.0 packs/day    Types: Cigarettes  . Smokeless tobacco: Never Used  . Alcohol Use: No    OB History    Grav Para Term Preterm Abortions TAB SAB Ect Mult Living                  Review of Systems  Musculoskeletal: Positive for myalgias and gait problem.  Neurological: Negative for tingling and headaches.  All other systems reviewed and are negative.    Allergies  Shellfish allergy; Fioricet; Imitrex; Ketorolac tromethamine; Lodine; Prednisone; Promethazine hcl; Reglan; Relpax; Stadol; Topamax; Tramadol; Vistaril; and Diflucan  Home Medications    Current Outpatient Rx  Name Route Sig Dispense Refill  . PERCOCET PO Oral Take by mouth.    . ASPIRIN 81 MG PO TABS Oral Take 81 mg by mouth daily.     . ATORVASTATIN CALCIUM 80 MG PO TABS Oral Take 80 mg by mouth daily.     [place] IN Inhalation Inhale 2 puffs into the lungs at bedtime.     Marland Kitchen CLONAZEPAM 1 MG PO TABS Oral Take 1 mg by mouth 4 (four) times daily.     . FUROSEMIDE 20 MG PO TABS Oral Take 20 mg by mouth daily.     Marland Kitchen GABAPENTIN 400 MG PO CAPS Oral Take 400 mg by mouth 2 (two) times daily.    Marland Kitchen LISINOPRIL 40 MG PO TABS Oral Take 40 mg by mouth daily.      . MORPHINE SULFATE 15 MG PO TABS Oral Take 15 mg by mouth 3 (three) times daily.     Marland Kitchen NITROGLYCERIN 0.4 MG SL SUBL Sublingual Place 0.4 mg under the tongue every 5 (five) minutes as needed. For chest pain    . PANTOPRAZOLE SODIUM 40 MG PO TBEC Oral Take 40 mg by mouth daily.       BP 113/77  Pulse 71  Temp 97.8 F (36.6 C) (Oral)  Resp 20  SpO2 97%  Physical Exam  Constitutional:  She appears well-developed and well-nourished.  HENT:  Head: Normocephalic and atraumatic.  Eyes: Pupils are equal, round, and reactive to light.  Neck: Normal range of motion. Neck supple.  Cardiovascular: Normal rate.   Pulmonary/Chest: Effort normal.  Abdominal: Soft.  Musculoskeletal: She exhibits tenderness.       Tender right hip,  Pain with movement.    Neurological: She is alert.  Skin: Skin is warm.    ED Course  Procedures (including critical care time)  Labs Reviewed - No data to display Dg Hip Complete Right  05/09/2012  *RADIOLOGY REPORT*  Clinical Data: Larey Seat.  Right hip pain.  RIGHT HIP - COMPLETE 2+ VIEW  Comparison: None  Findings: The hips are normally located.  No acute hip fracture. The pubic symphysis and SI joints are intact.  No pelvic fractures.  IMPRESSION: No acute bony findings.   Original Report Authenticated By: P. Loralie Champagne, M.D.    Dg Femur Right  05/09/2012  *RADIOLOGY REPORT*  Clinical  Data: Right leg pain.  RIGHT FEMUR - 2 VIEW  Comparison: None  Findings: The hip and knee joints are maintained.  No femur fracture.  IMPRESSION: No acute bony findings.   Original Report Authenticated By: P. Loralie Champagne, M.D.      No diagnosis found.    MDM  No fx.   Pt given morphine injection here.   Pt advised follow up with Md for recheck.     Pt reports she is currently taking percocet,  Pt reports she stopped morphine.  Pt filled rx for morphine 30mg  on sept 11.    I advised pt to call her MD and discuss pain management     Elson Areas, PA 05/09/12 2150  Lonia Skinner [place], [place] 05/09/12 2157

## 2012-05-09 NOTE — ED Notes (Signed)
Per patient: Deborah Woodward to do laundry this evening and hip popped. Pt states pain is in hip and leg. 10/10 pain

## 2012-05-10 NOTE — ED Provider Notes (Signed)
Medical screening examination/treatment/procedure(s) were performed by non-physician practitioner and as supervising physician I was immediately available for consultation/collaboration.  Charles B. Bernette Mayers, MD 05/10/12 [phone]

## 2012-09-17 ENCOUNTER — Emergency Department (HOSPITAL_BASED_OUTPATIENT_CLINIC_OR_DEPARTMENT_OTHER)
Admission: EM | Admit: 2012-09-17 | Discharge: 2012-09-18 | Disposition: A | Discharge status: Home / Self Care | Payer: BC Managed Care – PPO | Attending: Emergency Medicine | Admitting: Emergency Medicine

## 2012-09-17 ENCOUNTER — Encounter (HOSPITAL_BASED_OUTPATIENT_CLINIC_OR_DEPARTMENT_OTHER): Payer: Self-pay | Admitting: *Deleted

## 2012-09-17 DIAGNOSIS — Z8673 Personal history of transient ischemic attack (TIA), and cerebral infarction without residual deficits: Secondary | ICD-10-CM | POA: Insufficient documentation

## 2012-09-17 DIAGNOSIS — F411 Generalized anxiety disorder: Secondary | ICD-10-CM | POA: Insufficient documentation

## 2012-09-17 DIAGNOSIS — J4489 Other specified chronic obstructive pulmonary disease: Secondary | ICD-10-CM | POA: Insufficient documentation

## 2012-09-17 DIAGNOSIS — M549 Dorsalgia, unspecified: Secondary | ICD-10-CM

## 2012-09-17 DIAGNOSIS — Z8679 Personal history of other diseases of the circulatory system: Secondary | ICD-10-CM | POA: Insufficient documentation

## 2012-09-17 DIAGNOSIS — IMO0002 Reserved for concepts with insufficient information to code with codable children: Secondary | ICD-10-CM | POA: Insufficient documentation

## 2012-09-17 DIAGNOSIS — Z7982 Long term (current) use of aspirin: Secondary | ICD-10-CM | POA: Insufficient documentation

## 2012-09-17 DIAGNOSIS — M545 Low back pain, unspecified: Secondary | ICD-10-CM | POA: Insufficient documentation

## 2012-09-17 DIAGNOSIS — Z79899 Other long term (current) drug therapy: Secondary | ICD-10-CM | POA: Insufficient documentation

## 2012-09-17 DIAGNOSIS — J449 Chronic obstructive pulmonary disease, unspecified: Secondary | ICD-10-CM | POA: Insufficient documentation

## 2012-09-17 DIAGNOSIS — I252 Old myocardial infarction: Secondary | ICD-10-CM | POA: Insufficient documentation

## 2012-09-17 DIAGNOSIS — F17200 Nicotine dependence, unspecified, uncomplicated: Secondary | ICD-10-CM | POA: Insufficient documentation

## 2012-09-17 DIAGNOSIS — Z87442 Personal history of urinary calculi: Secondary | ICD-10-CM | POA: Insufficient documentation

## 2012-09-17 DIAGNOSIS — E7800 Pure hypercholesterolemia, unspecified: Secondary | ICD-10-CM | POA: Insufficient documentation

## 2012-09-17 LAB — URINALYSIS, ROUTINE W REFLEX MICROSCOPIC
Glucose, UA: NEGATIVE mg/dL
Hgb urine dipstick: NEGATIVE
Ketones, ur: NEGATIVE mg/dL
Leukocytes, UA: NEGATIVE
Protein, ur: NEGATIVE mg/dL
pH: 6 (ref 5.0–8.0)

## 2012-09-17 MED ORDER — DIAZEPAM 5 MG PO TABS: 5.0000 mg | ORAL_TABLET | Freq: Four times a day (QID) | ORAL | Status: AC | PRN

## 2012-09-17 MED ORDER — DIAZEPAM 5 MG/ML IJ SOLN
5.0000 mg | Freq: Once | INTRAMUSCULAR | Status: AC
  Administered 2012-09-17: 5 mg via INTRAVENOUS
  Filled 2012-09-17: qty 2

## 2012-09-17 MED ORDER — HYDROMORPHONE HCL PF 1 MG/ML IJ SOLN
1.0000 mg | Freq: Once | INTRAMUSCULAR | Status: AC
  Administered 2012-09-17: 1 mg via INTRAVENOUS
  Filled 2012-09-17: qty 1

## 2012-09-17 NOTE — ED Provider Notes (Signed)
History  This chart was scribed for Richardean Canal, MD by Shari Heritage, ED Scribe. The patient was seen in room MH06/MH06. Patient's care was started at 2147.   CSN: [id number]  Arrival date & time 09/17/12  2127   First MD Initiated Contact with Patient 09/17/12 2147      Chief Complaint  Patient presents with  . Back Pain    The history is provided by the patient. No language interpreter was used.    HPI Comments: Deborah Woodward is a 47 y.o. female with history of MI and stroke who presents to the Emergency Department complaining of moderate to severe, constant, dull left-sided lumbar back pain that radiates around to area under ribs onset 3 days ago. She states that pain began after standing up from a chair. She denies any other injury or trauma to the area. Patient is ambulatory. She denies any weakness, numbness, bladder incontinence or dysuria. She states that the pain does not feel similarly to pain associated with kidney stones. Patient states that she has taken morphine with no relief. She was prescribed morphine for chronic pain secondary to degenerative disc disease in the lumbar region. She states that current pain feels differently from heart attack symptoms. Patient other medical history includes COPD, migraines, high cholesterol and anxiety. Patient denies history of cardiac stents.    Past Medical History  Diagnosis Date  . Stroke   . MI (myocardial infarction)   . High cholesterol   . Degenerative disc disease   . COPD (chronic obstructive pulmonary disease)   . Migraines   . Anxiety     Past Surgical History  Procedure Laterality Date  . Abdominal hysterectomy    . Cardiac catheterization    . Cesarean section    . Cholecystectomy    . Tonsillectomy    . Appendectomy      History reviewed. No pertinent family history.  History  Substance Use Topics  . Smoking status: Current Every Day Smoker -- 1.00 packs/day    Types: Cigarettes  . Smokeless tobacco:  Never Used  . Alcohol Use: No    OB History   Grav Para Term Preterm Abortions TAB SAB Ect Mult Living                  Review of Systems  Genitourinary: Negative for hematuria and difficulty urinating.  Musculoskeletal: Positive for back pain.  Neurological: Negative for weakness and numbness.  All other systems reviewed and are negative.    Allergies  Shellfish allergy; Fioricet; Imitrex; Ketorolac tromethamine; Lodine; Prednisone; Promethazine hcl; Reglan; Relpax; Stadol; Topamax; Tramadol; Vistaril; and Diflucan  Home Medications   Current Outpatient Rx  Name  Route  Sig  Dispense  Refill  . aspirin 81 MG tablet   Oral   Take 81 mg by mouth daily.          Marland Kitchen atorvastatin (LIPITOR) 80 MG tablet   Oral   Take 80 mg by mouth daily.          . Budesonide-Formoterol Fumarate (SYMBICORT IN)   Inhalation   Inhale 2 puffs into the lungs at bedtime.          . clonazePAM (KLONOPIN) 1 MG tablet   Oral   Take 1 mg by mouth 4 (four) times daily.          . furosemide (LASIX) 20 MG tablet   Oral   Take 20 mg by mouth daily.          Marland Kitchen  gabapentin (NEURONTIN) 400 MG capsule   Oral   Take 400 mg by mouth 2 (two) times daily.         Marland Kitchen lisinopril (PRINIVIL,ZESTRIL) 40 MG tablet   Oral   Take 40 mg by mouth daily.           Marland Kitchen morphine (MSIR) 15 MG tablet   Oral   Take 15 mg by mouth 3 (three) times daily.          . nitroGLYCERIN (NITROSTAT) 0.4 MG SL tablet   Sublingual   Place 0.4 mg under the tongue every 5 (five) minutes as needed. For chest pain         . Oxycodone-Acetaminophen (PERCOCET PO)   Oral   Take by mouth.         . pantoprazole (PROTONIX) 40 MG tablet   Oral   Take 40 mg by mouth daily.            Triage Vitals: BP 130/72  Pulse 90  Temp(Src) 98 F (36.7 C) (Oral)  Resp 20  Ht 5\' 7"  (1.702 m)  Wt 214 lb (97.07 kg)  BMI 33.51 kg/m2  SpO2 99%  Physical Exam  Constitutional: She is oriented to person, place, and  time. She appears well-developed and well-nourished. No distress.  Appears uncomfortable.  HENT:  Head: Normocephalic and atraumatic.  Eyes: Conjunctivae and EOM are normal. Pupils are equal, round, and reactive to light.  Neck: Normal range of motion.  Cardiovascular: Normal rate, regular rhythm and normal heart sounds.   Pulmonary/Chest: Effort normal and breath sounds normal.  Musculoskeletal: She exhibits tenderness.  Left paralumbar vs CVA tenderness. Well-healed scar to same area.  Neurological: She is alert and oriented to person, place, and time.  Skin: Skin is warm and dry.  Psychiatric: She has a normal mood and affect. Her behavior is normal.    ED Course  Procedures (including critical care time) DIAGNOSTIC STUDIES: Oxygen Saturation is 99% on room air, normal by my interpretation.    COORDINATION OF CARE: 10:05 PM- Will give Valium 5 mg and Dilaudid 1 mg. Have also ordered UA to rule out kidney stone or infection. Patient informed of current plan for treatment and evaluation and agrees with plan at this time.   11:20 PM Patient felt much better after meds. UA neg for hematuria.    Labs Reviewed - No data to display No results found.   No diagnosis found.   Date: 09/17/2012  Rate: 81  Rhythm: normal sinus rhythm  QRS Axis: normal  Intervals: normal  ST/T Wave abnormalities: normal  Conduction Disutrbances:right bundle branch block  Narrative Interpretation:   Old EKG Reviewed: none available    MDM  Deborah Woodward is a 47 y.o. female here with back pain. Hx of kidney stone so UA obtained and showed no hematuria. She has no chest pain and said that this is different than her MI in the past. Her RBBB on EKG is old since 2010. Not concerned for ACS. Pain improved after meds. No concerning signs that required imaging. Will d/c home and will add valium in addition to her morphine. Return precautions given.    I personally performed the services described in  this documentation, which was scribed in my presence. The recorded information has been reviewed and is accurate.   Richardean Canal, MD 09/17/12 2328

## 2012-09-17 NOTE — ED Notes (Signed)
Pt states she has had left back and rib pain x 3 days. Hurts to move. No known injury. Denies urinary s/s. Feels like something is "stabbing" me. Denies other s/s.

## 2013-09-01 ENCOUNTER — Encounter (HOSPITAL_BASED_OUTPATIENT_CLINIC_OR_DEPARTMENT_OTHER): Payer: Self-pay | Admitting: Emergency Medicine

## 2013-09-01 ENCOUNTER — Emergency Department (HOSPITAL_BASED_OUTPATIENT_CLINIC_OR_DEPARTMENT_OTHER)
Admission: EM | Admit: 2013-09-01 | Discharge: 2013-09-01 | Disposition: A | Discharge status: Home / Self Care | Payer: BC Managed Care – PPO | Attending: Emergency Medicine | Admitting: Emergency Medicine

## 2013-09-01 DIAGNOSIS — F3289 Other specified depressive episodes: Secondary | ICD-10-CM | POA: Insufficient documentation

## 2013-09-01 DIAGNOSIS — Z8739 Personal history of other diseases of the musculoskeletal system and connective tissue: Secondary | ICD-10-CM | POA: Insufficient documentation

## 2013-09-01 DIAGNOSIS — Z9089 Acquired absence of other organs: Secondary | ICD-10-CM | POA: Insufficient documentation

## 2013-09-01 DIAGNOSIS — Z7982 Long term (current) use of aspirin: Secondary | ICD-10-CM | POA: Insufficient documentation

## 2013-09-01 DIAGNOSIS — Z95818 Presence of other cardiac implants and grafts: Secondary | ICD-10-CM | POA: Insufficient documentation

## 2013-09-01 DIAGNOSIS — F411 Generalized anxiety disorder: Secondary | ICD-10-CM | POA: Insufficient documentation

## 2013-09-01 DIAGNOSIS — G8929 Other chronic pain: Secondary | ICD-10-CM | POA: Insufficient documentation

## 2013-09-01 DIAGNOSIS — Z8673 Personal history of transient ischemic attack (TIA), and cerebral infarction without residual deficits: Secondary | ICD-10-CM | POA: Insufficient documentation

## 2013-09-01 DIAGNOSIS — G43909 Migraine, unspecified, not intractable, without status migrainosus: Secondary | ICD-10-CM | POA: Insufficient documentation

## 2013-09-01 DIAGNOSIS — J449 Chronic obstructive pulmonary disease, unspecified: Secondary | ICD-10-CM | POA: Insufficient documentation

## 2013-09-01 DIAGNOSIS — K219 Gastro-esophageal reflux disease without esophagitis: Secondary | ICD-10-CM | POA: Insufficient documentation

## 2013-09-01 DIAGNOSIS — F329 Major depressive disorder, single episode, unspecified: Secondary | ICD-10-CM | POA: Insufficient documentation

## 2013-09-01 DIAGNOSIS — E785 Hyperlipidemia, unspecified: Secondary | ICD-10-CM | POA: Insufficient documentation

## 2013-09-01 DIAGNOSIS — I252 Old myocardial infarction: Secondary | ICD-10-CM | POA: Insufficient documentation

## 2013-09-01 DIAGNOSIS — J4489 Other specified chronic obstructive pulmonary disease: Secondary | ICD-10-CM | POA: Insufficient documentation

## 2013-09-01 DIAGNOSIS — F17200 Nicotine dependence, unspecified, uncomplicated: Secondary | ICD-10-CM | POA: Insufficient documentation

## 2013-09-01 DIAGNOSIS — E7800 Pure hypercholesterolemia, unspecified: Secondary | ICD-10-CM | POA: Insufficient documentation

## 2013-09-01 DIAGNOSIS — Z79899 Other long term (current) drug therapy: Secondary | ICD-10-CM | POA: Insufficient documentation

## 2013-09-01 HISTORY — DX: Major depressive disorder, single episode, unspecified: F32.9

## 2013-09-01 HISTORY — DX: Depression, unspecified: F32.A

## 2013-09-01 HISTORY — DX: Gastro-esophageal reflux disease without esophagitis: K21.9

## 2013-09-01 HISTORY — DX: Dorsalgia, unspecified: M54.9

## 2013-09-01 HISTORY — DX: Chronic pain syndrome: G89.4

## 2013-09-01 HISTORY — DX: Atrial septal defect: Q21.1

## 2013-09-01 HISTORY — DX: Idiopathic sleep related nonobstructive alveolar hypoventilation: G47.34

## 2013-09-01 HISTORY — DX: Atrial septal defect, unspecified: Q21.10

## 2013-09-01 HISTORY — DX: Hyperlipidemia, unspecified: E78.5

## 2013-09-01 HISTORY — DX: Unspecified asthma, uncomplicated: J45.909

## 2013-09-01 HISTORY — DX: Cerebrovascular disease, unspecified: I67.9

## 2013-09-01 MED ORDER — OXYCODONE-ACETAMINOPHEN 5-325 MG PO TABS: 1.0000 | ORAL_TABLET | Freq: Four times a day (QID) | ORAL | Status: AC | PRN

## 2013-09-01 MED ORDER — ONDANSETRON HCL 4 MG/2ML IJ SOLN
4.0000 mg | Freq: Once | INTRAMUSCULAR | Status: AC
  Administered 2013-09-01: 4 mg via INTRAMUSCULAR
  Filled 2013-09-01: qty 2

## 2013-09-01 MED ORDER — HYDROMORPHONE HCL PF 2 MG/ML IJ SOLN
2.0000 mg | Freq: Once | INTRAMUSCULAR | Status: AC
  Administered 2013-09-01: 2 mg via INTRAMUSCULAR
  Filled 2013-09-01: qty 1

## 2013-09-01 NOTE — Discharge Instructions (Signed)
Migraine Headache A migraine headache is an intense, throbbing pain on one or both sides of your head. A migraine can last for 30 minutes to several hours. CAUSES  The exact cause of a migraine headache is not always known. However, a migraine may be caused when nerves in the brain become irritated and release chemicals that cause inflammation. This causes pain. Certain things may also trigger migraines, such as:  Alcohol.  Smoking.  Stress.  Menstruation.  Aged cheeses.  Foods or drinks that contain nitrates, glutamate, aspartame, or tyramine.  Lack of sleep.  Chocolate.  Caffeine.  Hunger.  Physical exertion.  Fatigue.  Medicines used to treat chest pain (nitroglycerine), birth control pills, estrogen, and some blood pressure medicines. SIGNS AND SYMPTOMS  Pain on one or both sides of your head.  Pulsating or throbbing pain.  Severe pain that prevents daily activities.  Pain that is aggravated by any physical activity.  Nausea, vomiting, or both.  Dizziness.  Pain with exposure to bright lights, loud noises, or activity.  General sensitivity to bright lights, loud noises, or smells. Before you get a migraine, you may get warning signs that a migraine is coming (aura). An aura may include:  Seeing flashing lights.  Seeing bright spots, halos, or zig-zag lines.  Having tunnel vision or blurred vision.  Having feelings of numbness or tingling.  Having trouble talking.  Having muscle weakness. DIAGNOSIS  A migraine headache is often diagnosed based on:  Symptoms.  Physical exam.  A CT scan or MRI of your head. These imaging tests cannot diagnose migraines, but they can help rule out other causes of headaches. TREATMENT Medicines may be given for pain and nausea. Medicines can also be given to help prevent recurrent migraines.  HOME CARE INSTRUCTIONS  Only take over-the-counter or prescription medicines for pain or discomfort as directed by your  health care provider. The use of long-term narcotics is not recommended.  Lie down in a dark, quiet room when you have a migraine.  Keep a journal to find out what may trigger your migraine headaches. For example, write down:  What you eat and drink.  How much sleep you get.  Any change to your diet or medicines.  Limit alcohol consumption.  Quit smoking if you smoke.  Get 7 9 hours of sleep, or as recommended by your health care provider.  Limit stress.  Keep lights dim if bright lights bother you and make your migraines worse. SEEK IMMEDIATE MEDICAL CARE IF:   Your migraine becomes severe.  You have a fever.  You have a stiff neck.  You have vision loss.  You have muscular weakness or loss of muscle control.  You start losing your balance or have trouble walking.  You feel faint or pass out.  You have severe symptoms that are different from your first symptoms. MAKE SURE YOU:   Understand these instructions.  Will watch your condition.  Will get help right away if you are not doing well or get worse. Document Released: 07/26/2005 Document Revised: 05/16/2013 Document Reviewed: 04/02/2013 [employer] Patient Information 2014 [employer].  

## 2013-09-01 NOTE — ED Provider Notes (Signed)
CSN: [id number]     Arrival date & time 09/01/13  1513 History  This chart was scribed for Deborah Jakes, MD by Ronal Fear, ED Scribe. This patient was seen in room MH08/MH08 and the patient's care was started at 4:41 PM.    Chief Complaint  Patient presents with  . Migraine   (Consider location/radiation/quality/duration/timing/severity/associated sxs/prior Treatment) HPI HPI Comments: Deborah Woodward is a 48 y.o. female with a hx of migraines who presents to the Emergency Department complaining of sudden onset 10/10 migraine that started 1x day ago to her bilateral temple with associated nausea and vomitting, photophobia. Pt has had previous occurences of similar migraines. She typically is given dilaudid and Zofran with relief.   Past Medical History  Diagnosis Date  . Stroke   . MI (myocardial infarction)   . High cholesterol   . Degenerative disc disease   . COPD (chronic obstructive pulmonary disease)   . Migraines   . Anxiety   . Pain syndrome, chronic   . Back pain   . Asthma   . Depression   . GERD (gastroesophageal reflux disease)   . Cerebral artery disease   . Atrial septal defect   . Nocturnal hypoxia   . Hyperlipemia    Past Surgical History  Procedure Laterality Date  . Abdominal hysterectomy    . Cardiac catheterization    . Cesarean section    . Cholecystectomy    . Tonsillectomy    . Appendectomy     No family history on file. History  Substance Use Topics  . Smoking status: Current Every Day Smoker -- 1.00 packs/day    Types: Cigarettes  . Smokeless tobacco: Never Used  . Alcohol Use: No   OB History   Grav Para Term Preterm Abortions TAB SAB Ect Mult Living                 Review of Systems  Constitutional: Positive for chills. Negative for fever.  HENT: Negative for rhinorrhea and sore throat.   Eyes: Positive for photophobia. Negative for visual disturbance.  Respiratory: Negative for cough and shortness of breath.    Cardiovascular: Negative for chest pain and leg swelling.  Gastrointestinal: Positive for nausea and vomiting. Negative for abdominal pain and diarrhea.  Genitourinary: Negative for dysuria.  Musculoskeletal: Negative for back pain and neck pain.  Skin: Negative for rash.  Neurological: Positive for headaches. Negative for dizziness.  Hematological: Does not bruise/bleed easily.  Psychiatric/Behavioral: Negative for confusion.    Allergies  Shellfish allergy; Cymbalta; Depakote; Desipramine; Fioricet; Imitrex; Ketorolac tromethamine; Levaquin; Lexapro; Lodine; Lovastatin; Prednisone; Promethazine hcl; Reglan; Relpax; Stadol; Topamax; Tramadol; Verapamil; Vistaril; and Diflucan  Home Medications   Current Outpatient Rx  Name  Route  Sig  Dispense  Refill  . albuterol (ACCUNEB) 0.63 MG/3ML nebulizer solution   Nebulization   Take 1 ampule by nebulization every 4 (four) hours as needed for wheezing.         Marland Kitchen albuterol (PROVENTIL HFA;VENTOLIN HFA) 108 (90 BASE) MCG/ACT inhaler   Inhalation   Inhale into the lungs every 4 (four) hours as needed for wheezing or shortness of breath.         . calcium carbonate (TUMS EX) 750 MG chewable tablet   Oral   Chew 1 tablet by mouth daily.         . ondansetron (ZOFRAN) 8 MG tablet   Oral   Take by mouth every 8 (eight) hours as needed for nausea or vomiting.         Marland Kitchen  simvastatin (ZOCOR) 20 MG tablet   Oral   Take 20 mg by mouth daily.         . traZODone (DESYREL) 150 MG tablet   Oral   Take by mouth at bedtime.         Marland Kitchen. aspirin 81 MG tablet   Oral   Take 81 mg by mouth daily.          Marland Kitchen. atorvastatin (LIPITOR) 80 MG tablet   Oral   Take 80 mg by mouth daily.          . Budesonide-Formoterol Fumarate (SYMBICORT IN)   Inhalation   Inhale 2 puffs into the lungs at bedtime.          . clonazePAM (KLONOPIN) 1 MG tablet   Oral   Take 1 mg by mouth 3 (three) times daily as needed.          . diazepam  (VALIUM) 5 MG tablet   Oral   Take 1 tablet (5 mg total) by mouth every 6 (six) hours as needed for anxiety (spasms).   10 tablet   0   . furosemide (LASIX) 20 MG tablet   Oral   Take 20 mg by mouth daily.          Marland Kitchen. gabapentin (NEURONTIN) 400 MG capsule   Oral   Take 900 mg by mouth 3 (three) times daily.          Marland Kitchen. lisinopril (PRINIVIL,ZESTRIL) 40 MG tablet   Oral   Take 40 mg by mouth daily.           Marland Kitchen. morphine (MSIR) 15 MG tablet   Oral   Take 15 mg by mouth 3 (three) times daily.          . nitroGLYCERIN (NITROSTAT) 0.4 MG SL tablet   Sublingual   Place 0.4 mg under the tongue every 5 (five) minutes as needed. For chest pain         . Oxycodone-Acetaminophen (PERCOCET PO)   Oral   Take by mouth.         . oxyCODONE-acetaminophen (PERCOCET/ROXICET) 5-325 MG per tablet   Oral   Take 1 tablet by mouth every 6 (six) hours as needed for severe pain.   15 tablet   0   . pantoprazole (PROTONIX) 40 MG tablet   Oral   Take 40 mg by mouth daily.           BP 127/98  Temp(Src) 98.9 F (37.2 C)  Resp 20  Ht 5\' 7"  (1.702 m)  Wt 212 lb (96.163 kg)  BMI 33.20 kg/m2  SpO2 97% Physical Exam  Nursing note and vitals reviewed. Constitutional: She is oriented to person, place, and time. She appears well-developed and well-nourished. No distress.  HENT:  Head: Normocephalic and atraumatic.  Eyes: EOM are normal.  Neck: Neck supple. No tracheal deviation present.  Cardiovascular: Normal rate and regular rhythm.   Pulmonary/Chest: Effort normal and breath sounds normal. No respiratory distress.  Abdominal: Soft. Bowel sounds are normal. There is no tenderness.  Musculoskeletal: Normal range of motion. She exhibits no edema.  Neurological: She is alert and oriented to person, place, and time.  Skin: Skin is warm and dry. No rash noted.  Psychiatric: She has a normal mood and affect. Her behavior is normal.    ED Course  Procedures (including critical care  time)  DIAGNOSTIC STUDIES: Oxygen Saturation is 97% on RA, adequate by my interpretation.  COORDINATION OF CARE: 4:45 PM- Pt advised of plan for treatment including Zofran and dilaudid and pt agrees.    Labs Review Labs Reviewed - No data to display Imaging Review No results found.  EKG Interpretation   None       MDM   1. Migraine    A patient with typical migraine. Patient with history of migraines. Patient sniffily improved with IM hydromorphone and IM Zofran. Patient feels much better and ready for discharge home.  I personally performed the services described in this documentation, which was scribed in my presence. The recorded information has been reviewed and is accurate.     Deborah Jakes, MD 09/01/13 [phone]

## 2013-09-01 NOTE — ED Notes (Signed)
Onset of migraine yesterday.  Similar to previous migraines. Nausea/vomiting/eyes sensitive to light.  Medications at home not helping.

## 2013-10-07 DEATH — deceased

## 2013-11-15 IMAGING — CR DG KNEE COMPLETE 4+V*L*
4 series · 4 of 4 positions shown · non-contrast
Comparison: 10/04/2011.

CLINICAL DATA: 45-year-old female with twisting injury and pain.

LEFT KNEE - COMPLETE 4+ VIEW

[t knee ap left]
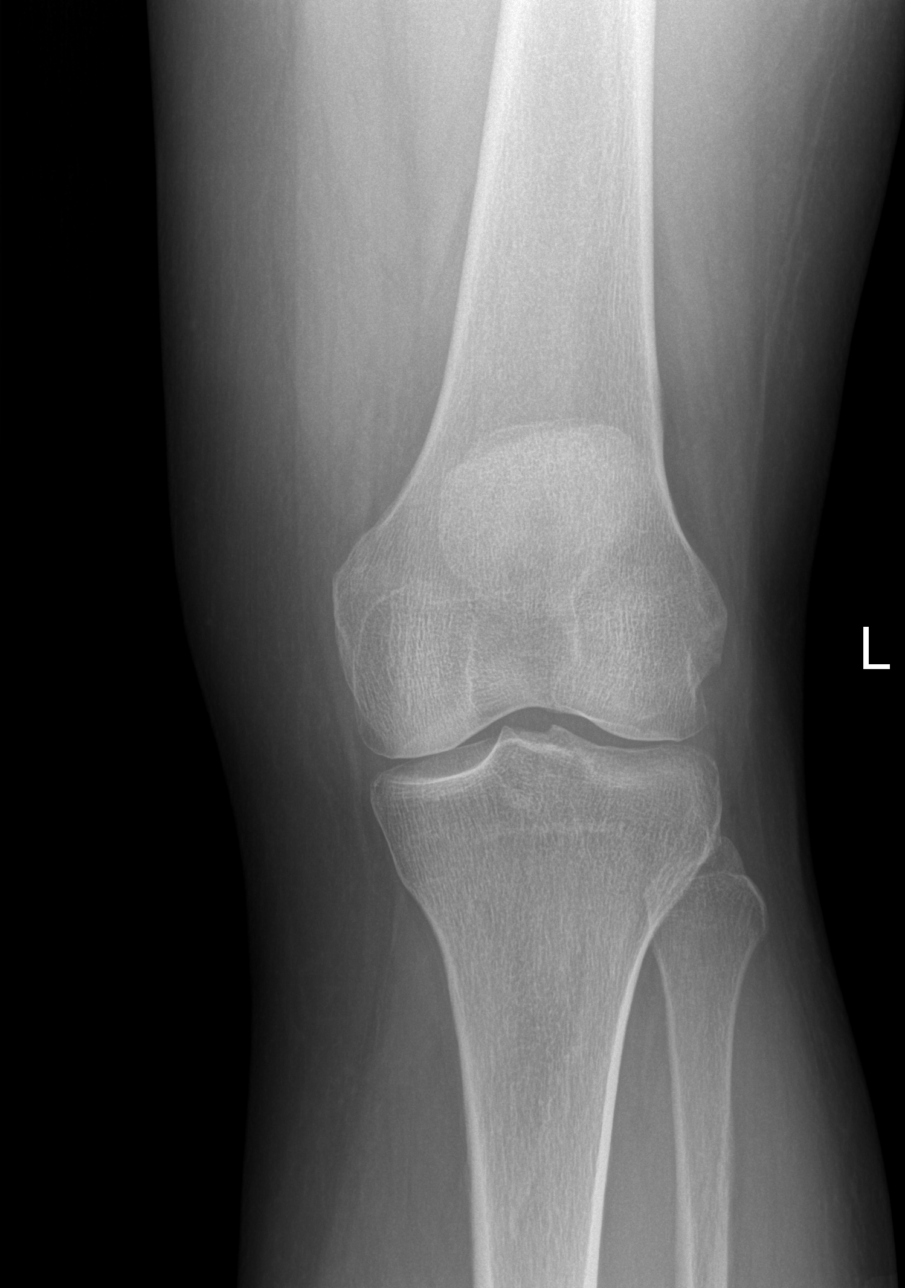

[t knee oblique left (1 of 2)]
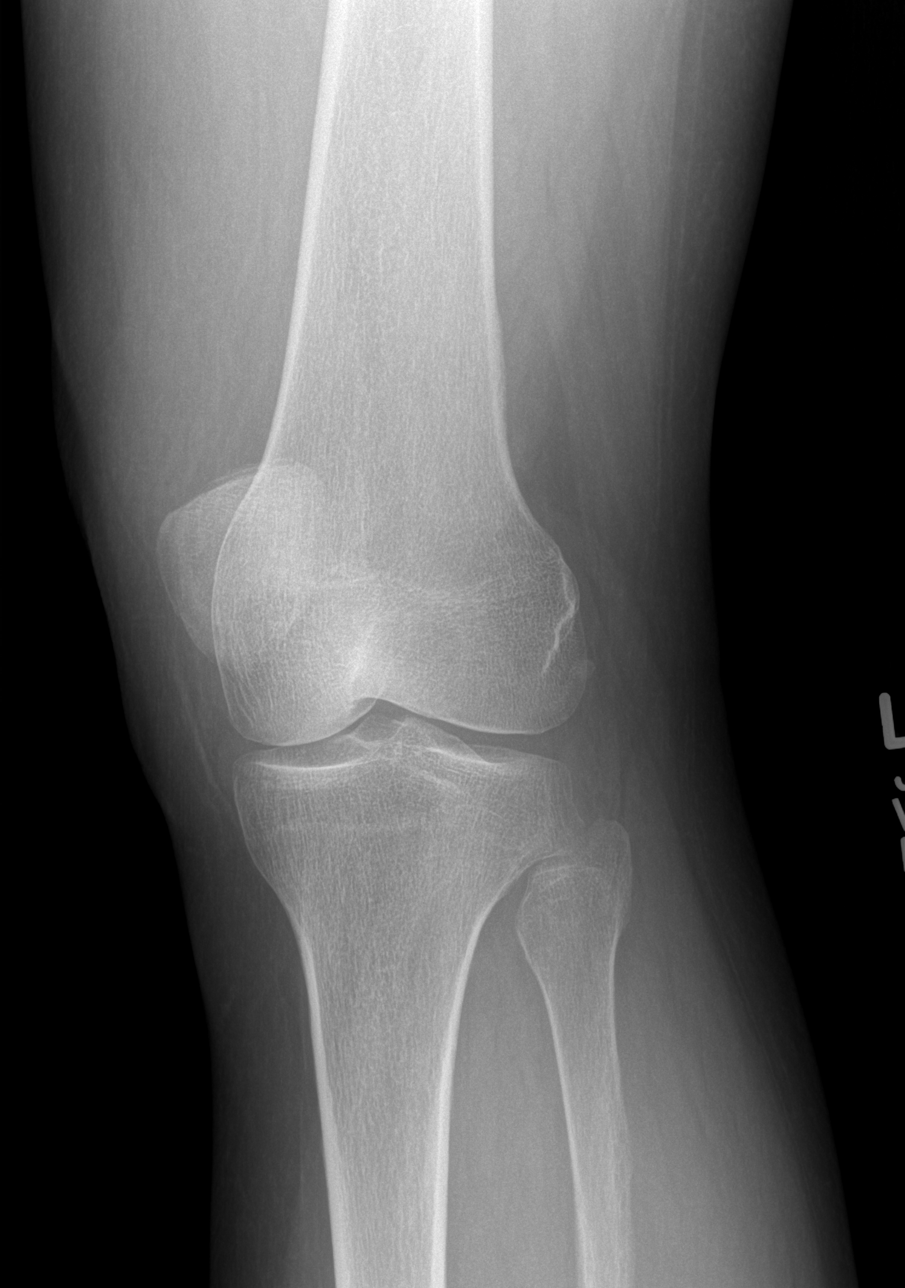

[t knee oblique left (2 of 2)]
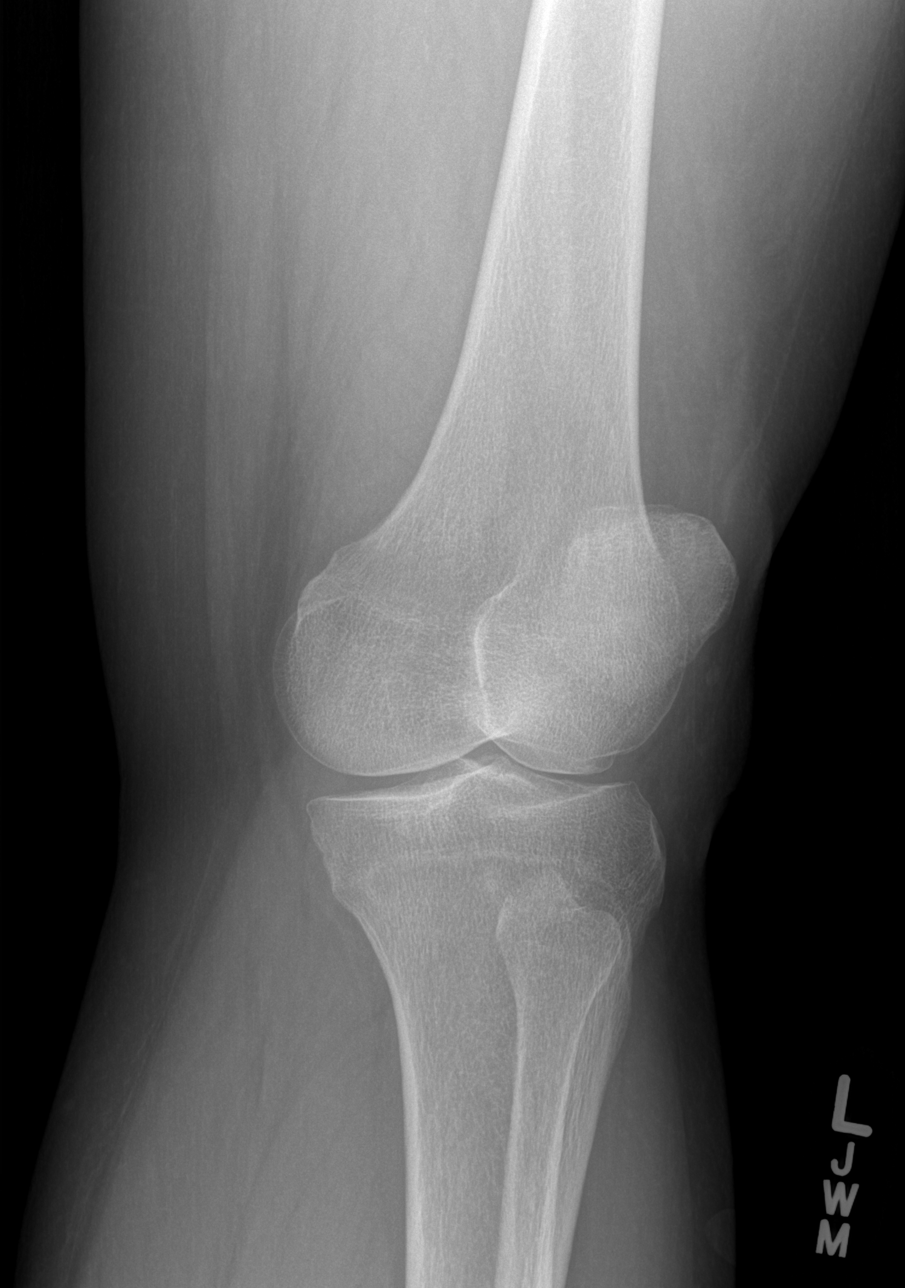

[t knee lat left]
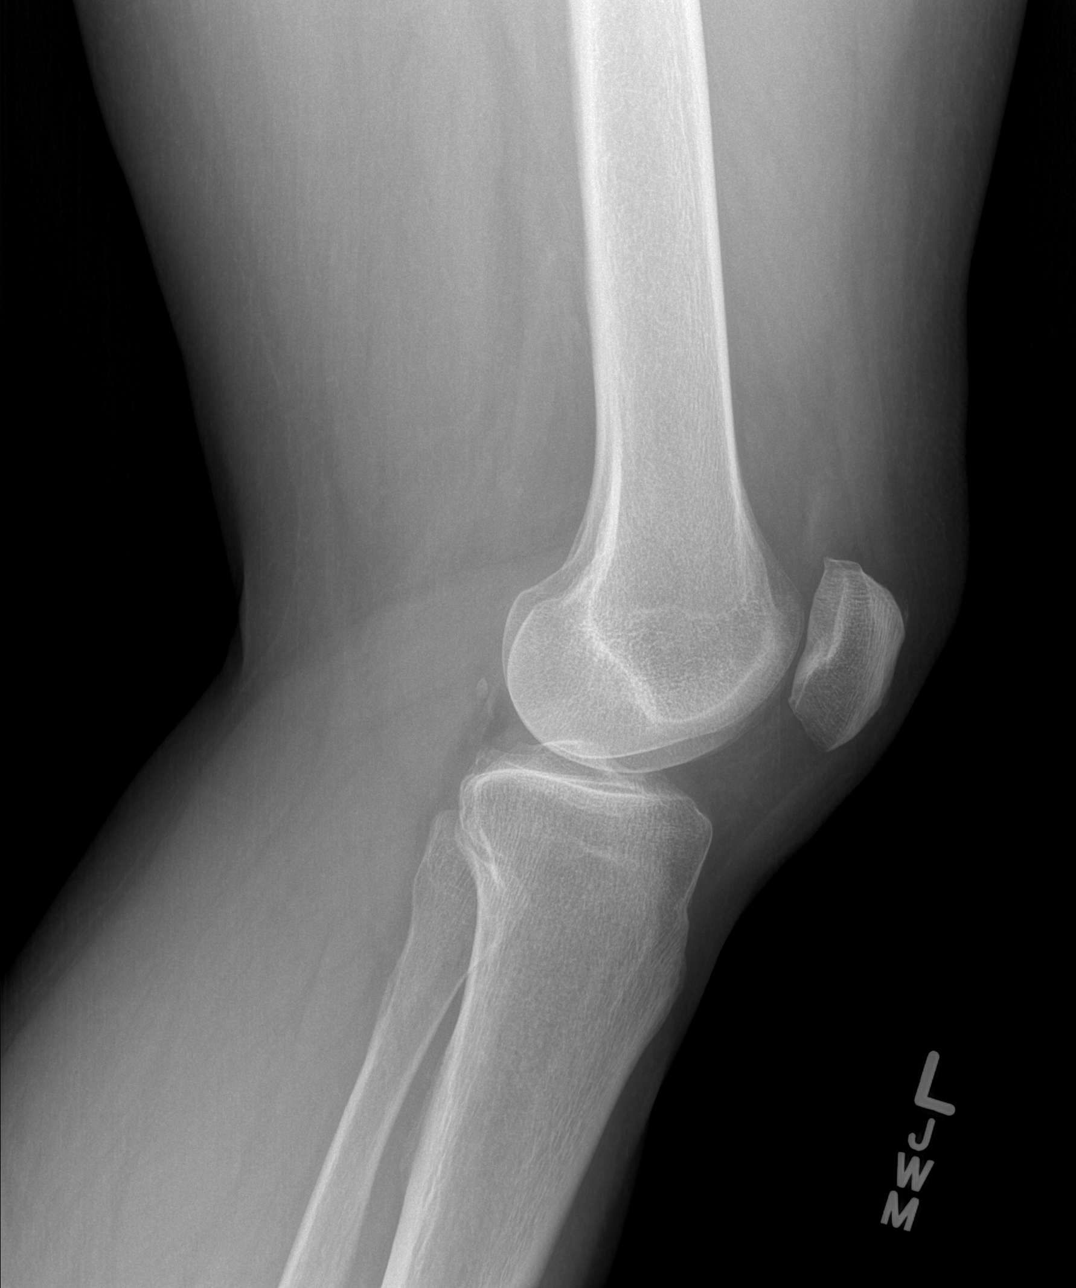

[4 of 4 positions shown; findings below may reference images not displayed]

FINDINGS: Small suprapatellar joint effusion.  Patella intact.
Joint spaces appear preserved.  No acute fracture or dislocation.
IMPRESSION: Small joint effusion with no acute fracture or dislocation about
the left knee.
